# Patient Record
Sex: Female | Born: 1975 | Race: Black or African American | Hispanic: No | Marital: Married | Smoking: Never smoker
Health system: Southern US, Community
[De-identification: ages and names within clinical notes are randomized; demographics above are authoritative.]

## PROBLEM LIST (undated history)

## (undated) DIAGNOSIS — J45909 Unspecified asthma, uncomplicated: Secondary | ICD-10-CM

## (undated) DIAGNOSIS — T7840XA Allergy, unspecified, initial encounter: Secondary | ICD-10-CM

## (undated) HISTORY — DX: Unspecified asthma, uncomplicated: J45.909

## (undated) HISTORY — DX: Allergy, unspecified, initial encounter: T78.40XA

---

## 2002-04-24 ENCOUNTER — Ambulatory Visit (HOSPITAL_COMMUNITY): Admission: RE | Admit: 2002-04-24 | Discharge: 2002-04-24 | Payer: Self-pay

## 2002-08-07 ENCOUNTER — Ambulatory Visit (HOSPITAL_COMMUNITY): Admission: RE | Admit: 2002-08-07 | Discharge: 2002-08-07 | Payer: Self-pay

## 2002-08-08 ENCOUNTER — Observation Stay (HOSPITAL_COMMUNITY): Admission: AD | Admit: 2002-08-08 | Discharge: 2002-08-08 | Payer: Self-pay

## 2003-05-19 ENCOUNTER — Ambulatory Visit (HOSPITAL_COMMUNITY): Admission: RE | Admit: 2003-05-19 | Discharge: 2003-05-19 | Payer: Self-pay

## 2003-08-26 ENCOUNTER — Ambulatory Visit (HOSPITAL_COMMUNITY): Admission: RE | Admit: 2003-08-26 | Discharge: 2003-08-26 | Payer: Self-pay | Admitting: Obstetrics and Gynecology

## 2003-08-26 ENCOUNTER — Encounter: Payer: Self-pay | Admitting: Obstetrics and Gynecology

## 2003-09-02 ENCOUNTER — Encounter: Payer: Self-pay | Admitting: Obstetrics and Gynecology

## 2003-09-02 ENCOUNTER — Ambulatory Visit (HOSPITAL_COMMUNITY): Admission: RE | Admit: 2003-09-02 | Discharge: 2003-09-02 | Payer: Self-pay | Admitting: Obstetrics and Gynecology

## 2003-09-09 ENCOUNTER — Encounter: Payer: Self-pay | Admitting: Obstetrics and Gynecology

## 2003-09-09 ENCOUNTER — Ambulatory Visit (HOSPITAL_COMMUNITY): Admission: RE | Admit: 2003-09-09 | Discharge: 2003-09-09 | Payer: Self-pay | Admitting: Obstetrics and Gynecology

## 2003-09-24 ENCOUNTER — Inpatient Hospital Stay (HOSPITAL_COMMUNITY): Admission: RE | Admit: 2003-09-24 | Discharge: 2003-09-30 | Payer: Self-pay | Admitting: Obstetrics and Gynecology

## 2003-10-13 ENCOUNTER — Ambulatory Visit (HOSPITAL_COMMUNITY): Admission: RE | Admit: 2003-10-13 | Discharge: 2003-10-13 | Payer: Self-pay | Admitting: Obstetrics and Gynecology

## 2003-11-03 ENCOUNTER — Encounter (INDEPENDENT_AMBULATORY_CARE_PROVIDER_SITE_OTHER): Payer: Self-pay

## 2003-11-03 ENCOUNTER — Inpatient Hospital Stay (HOSPITAL_COMMUNITY): Admission: AD | Admit: 2003-11-03 | Discharge: 2003-11-06 | Payer: Self-pay | Admitting: Obstetrics and Gynecology

## 2004-04-25 ENCOUNTER — Other Ambulatory Visit: Admission: RE | Admit: 2004-04-25 | Discharge: 2004-04-25 | Payer: Self-pay | Admitting: Obstetrics and Gynecology

## 2005-04-20 ENCOUNTER — Other Ambulatory Visit: Admission: RE | Admit: 2005-04-20 | Discharge: 2005-04-20 | Payer: Self-pay | Admitting: Obstetrics and Gynecology

## 2005-05-24 ENCOUNTER — Observation Stay (HOSPITAL_COMMUNITY): Admission: AD | Admit: 2005-05-24 | Discharge: 2005-05-25 | Payer: Self-pay | Admitting: Obstetrics and Gynecology

## 2005-11-10 ENCOUNTER — Inpatient Hospital Stay (HOSPITAL_COMMUNITY): Admission: AD | Admit: 2005-11-10 | Discharge: 2005-11-12 | Payer: Self-pay | Admitting: Obstetrics and Gynecology

## 2006-06-20 ENCOUNTER — Other Ambulatory Visit: Admission: RE | Admit: 2006-06-20 | Discharge: 2006-06-20 | Payer: Self-pay | Admitting: Obstetrics and Gynecology

## 2007-10-14 ENCOUNTER — Other Ambulatory Visit: Admission: RE | Admit: 2007-10-14 | Discharge: 2007-10-14 | Payer: Self-pay | Admitting: Obstetrics and Gynecology

## 2008-01-01 ENCOUNTER — Ambulatory Visit: Payer: Self-pay | Admitting: Family Medicine

## 2008-01-01 ENCOUNTER — Ambulatory Visit: Payer: Self-pay | Admitting: *Deleted

## 2008-01-01 ENCOUNTER — Encounter (INDEPENDENT_AMBULATORY_CARE_PROVIDER_SITE_OTHER): Payer: Self-pay | Admitting: Internal Medicine

## 2008-01-01 LAB — CONVERTED CEMR LAB
ALT: 10 units/L (ref 0–35)
AST: 15 units/L (ref 0–37)
Albumin: 4.2 g/dL (ref 3.5–5.2)
Alkaline Phosphatase: 50 units/L (ref 39–117)
BUN: 12 mg/dL (ref 6–23)
Basophils Absolute: 0 10*3/uL (ref 0.0–0.1)
Basophils Relative: 1 % (ref 0–1)
CO2: 24 meq/L (ref 19–32)
Calcium: 9.5 mg/dL (ref 8.4–10.5)
Chloride: 102 meq/L (ref 96–112)
Cholesterol: 179 mg/dL (ref 0–200)
Creatinine, Ser: 0.81 mg/dL (ref 0.40–1.20)
Eosinophils Absolute: 0.2 10*3/uL (ref 0.0–0.7)
Eosinophils Relative: 3 % (ref 0–5)
Glucose, Bld: 78 mg/dL (ref 70–99)
HCT: 44.6 % (ref 36.0–46.0)
HDL: 53 mg/dL (ref >39)
Hemoglobin: 14.6 g/dL (ref 12.0–15.0)
LDL Cholesterol: 112 mg/dL — ABNORMAL HIGH (ref 0–99)
Lymphocytes Relative: 42 % (ref 12–46)
Lymphs Abs: 2.7 10*3/uL (ref 0.7–4.0)
MCHC: 32.7 g/dL (ref 30.0–36.0)
MCV: 86.4 fL (ref 78.0–100.0)
Monocytes Absolute: 0.4 10*3/uL (ref 0.1–1.0)
Monocytes Relative: 6 % (ref 3–12)
Neutro Abs: 3.1 10*3/uL (ref 1.7–7.7)
Neutrophils Relative %: 48 % (ref 43–77)
Platelets: 323 10*3/uL (ref 150–400)
Potassium: 4.2 meq/L (ref 3.5–5.3)
RBC: 5.16 M/uL — ABNORMAL HIGH (ref 3.87–5.11)
RDW: 12.5 % (ref 11.5–15.5)
Sodium: 137 meq/L (ref 135–145)
Total Bilirubin: 2.5 mg/dL — ABNORMAL HIGH (ref 0.3–1.2)
Total CHOL/HDL Ratio: 3.4
Total Protein: 7.9 g/dL (ref 6.0–8.3)
Triglycerides: 69 mg/dL (ref <150)
VLDL: 14 mg/dL (ref 0–40)
WBC: 6.4 10*3/uL (ref 4.0–10.5)

## 2009-04-16 ENCOUNTER — Other Ambulatory Visit: Admission: RE | Admit: 2009-04-16 | Discharge: 2009-04-16 | Payer: Self-pay | Admitting: Obstetrics and Gynecology

## 2010-03-29 ENCOUNTER — Ambulatory Visit: Payer: Self-pay | Admitting: Internal Medicine

## 2010-04-29 ENCOUNTER — Other Ambulatory Visit: Admission: RE | Admit: 2010-04-29 | Discharge: 2010-04-29 | Payer: Self-pay | Admitting: Obstetrics and Gynecology

## 2011-04-07 NOTE — Op Note (Signed)
Wendy Tyler, Wendy Tyler              ACCOUNT NO.:  [id number]   MEDICAL RECORD NO.:  [id number]          PATIENT TYPE:  MAT   LOCATION:  [facility]                          FACILITY:  [facility]   PHYSICIAN:  Charles A. Delcambre, MDDATE OF BIRTH:  Jan 01, 1976   DATE OF PROCEDURE:  05/24/2005  DATE OF DISCHARGE:                                 OPERATIVE REPORT   PREOPERATIVE DIAGNOSES:  1.  Incompetent cervix.  2.  A 14-week intrauterine pregnancy.   POSTOPERATIVE DIAGNOSES:  1.  Incompetent cervix.  2.  A 14-week intrauterine pregnancy.   PROCEDURE:  Cervical cerclage, McDonald type.   SURGEON:  Charles A. Sydnee Cabal, M.D.   ASSISTANT:  None.   COMPLICATIONS:  Bleeding vessel at 4 o'clock.   ANESTHESIA:  Spinal.   SPECIMENS:  None.   DESCRIPTION OF PROCEDURE:  The patient was taken to the operating room and  placed in the supine position after spinal was done.  She was then placed in  the dorsal lithotomy position, and sterile prep and drape was undertaken.  Weighted speculum was placed in the vagina.  Ring forceps were used to grasp  the anterior and posterior lips of the cervix.  A #2 Ethelon was placed,  with stitches at 11 to 10 o'clock, 8 to 7 o'clock, 5 to 4 o'clock, and 2 to  1 o'clock and tied with enough tension to pass a small dilator through the  cervical os, with the knot at 12 o'clock.  Knot was left long enough to find  the suture for ease in cutting suture away later.  This cinched the cervix  down nicely.  There was active bleeding noted from the stitch penetrating at  4 o'clock, with tension from tying the suture down.  This did slow somewhat,  but a figure-of-eight suture with 2-0 Vicryl in this area did achieve  excellent hemostasis.  Further visualization yielded excellent hemostasis.  There was no evidence of rupture of membranes or prolapse of membranes or  significant effacement of the cervix.  The patient tolerated the procedure  well and was taken to recovery  with physician present .      CAD/MEDQ  D:  05/24/2005  T:  05/24/2005  Job:  [id number]

## 2011-04-07 NOTE — Discharge Summary (Signed)
NAME:  Wendy Tyler, Wendy Tyler                        ACCOUNT NO.:  [id number]   MEDICAL RECORD NO.:  [id number]                   PATIENT TYPE:  INP   LOCATION:  9110                                 FACILITY:  [facility]   PHYSICIAN:  Charles A. Sydnee Cabal, MD            DATE OF BIRTH:  June 23, 1976   DATE OF ADMISSION:  09/24/2003  DATE OF DISCHARGE:  09/30/2003                                 DISCHARGE SUMMARY   PRIMARY DISCHARGE DIAGNOSIS:  Preterm labor versus incompetent cervix.   PROCEDURE:  Hospitalization for bedrest, has failed bedrest at home.  Cervix  melted to 0.   For history and physical, see please dictated note in the chart.   HOSPITAL COURSE:  The patient was admitted and underwent bedrest.  She had  daily NSTs, was not noted to be contracting but prophylactically with  history of cervix going to 0 length, prophylactic terbutaline 2.5 mg q.4  hours was given.  Betamethasone was given two doses 24-hours apart during  the hospitalization initially.  She continued to be stable throughout the  hospitalization without further problem, and for this reason with cervix  posterior, soft and 90% to 100% completely effaced, vertex to palpation less  than or equal to 1 cm, no significant change from prior examination, the  patient was discharged home after reaching gestational age of [redacted] weeks 2  days.  She was discharged home to complete bedrest with only bathroom  privileges and to continue terbutaline 2.5 mg p.o. q.4 hours until 36 weeks.                                               Charles A. Sydnee Cabal, MD    CAD/MEDQ  D:  10/21/2003  T:  10/21/2003  Job:  [id number]

## 2011-05-22 ENCOUNTER — Other Ambulatory Visit (HOSPITAL_COMMUNITY)
Admission: RE | Admit: 2011-05-22 | Discharge: 2011-05-22 | Disposition: A | Discharge status: Home / Self Care | Payer: Medicaid Other | Source: Ambulatory Visit | Attending: Obstetrics and Gynecology | Admitting: Obstetrics and Gynecology

## 2011-05-22 ENCOUNTER — Other Ambulatory Visit: Payer: Self-pay | Admitting: Obstetrics and Gynecology

## 2011-05-22 DIAGNOSIS — Z01419 Encounter for gynecological examination (general) (routine) without abnormal findings: Secondary | ICD-10-CM | POA: Insufficient documentation

## 2012-05-21 ENCOUNTER — Other Ambulatory Visit: Payer: Self-pay | Admitting: Obstetrics and Gynecology

## 2012-05-21 ENCOUNTER — Other Ambulatory Visit (HOSPITAL_COMMUNITY)
Admission: RE | Admit: 2012-05-21 | Discharge: 2012-05-21 | Disposition: A | Discharge status: Home / Self Care | Payer: Medicaid Other | Source: Ambulatory Visit | Attending: Obstetrics and Gynecology | Admitting: Obstetrics and Gynecology

## 2012-05-21 DIAGNOSIS — Z01419 Encounter for gynecological examination (general) (routine) without abnormal findings: Secondary | ICD-10-CM | POA: Insufficient documentation

## 2012-08-08 ENCOUNTER — Emergency Department (HOSPITAL_COMMUNITY)
Admission: EM | Admit: 2012-08-08 | Discharge: 2012-08-09 | Disposition: A | Discharge status: Home / Self Care | Payer: Self-pay | Attending: Emergency Medicine | Admitting: Emergency Medicine

## 2012-08-08 ENCOUNTER — Emergency Department (HOSPITAL_COMMUNITY): Payer: Self-pay

## 2012-08-08 ENCOUNTER — Encounter (HOSPITAL_COMMUNITY): Payer: Self-pay | Admitting: *Deleted

## 2012-08-08 DIAGNOSIS — S43014A Anterior dislocation of right humerus, initial encounter: Secondary | ICD-10-CM | POA: Diagnosis present

## 2012-08-08 DIAGNOSIS — W108XXA Fall (on) (from) other stairs and steps, initial encounter: Secondary | ICD-10-CM | POA: Insufficient documentation

## 2012-08-08 DIAGNOSIS — S43016A Anterior dislocation of unspecified humerus, initial encounter: Secondary | ICD-10-CM | POA: Insufficient documentation

## 2012-08-08 MED ORDER — FENTANYL CITRATE 0.05 MG/ML IJ SOLN
100.0000 ug | Freq: Once | INTRAMUSCULAR | Status: AC
  Administered 2012-08-08: 100 ug via INTRAVENOUS
  Filled 2012-08-08: qty 2

## 2012-08-08 MED ORDER — PROPOFOL 10 MG/ML IV BOLUS
INTRAVENOUS | Status: AC
  Filled 2012-08-08: qty 40

## 2012-08-08 MED ORDER — MORPHINE SULFATE 4 MG/ML IJ SOLN
4.0000 mg | Freq: Once | INTRAMUSCULAR | Status: AC
  Administered 2012-08-08: 4 mg via INTRAVENOUS
  Filled 2012-08-08: qty 1

## 2012-08-08 MED ORDER — PROPOFOL 10 MG/ML IV BOLUS: 0.5000 mg/kg | Freq: Once | INTRAVENOUS | Status: DC

## 2012-08-08 MED ORDER — SODIUM CHLORIDE 0.9 % IV BOLUS (SEPSIS)
1000.0000 mL | INTRAVENOUS | Status: AC
  Administered 2012-08-08: 1000 mL via INTRAVENOUS

## 2012-08-08 NOTE — ED Notes (Signed)
Pt. In xray 

## 2012-08-08 NOTE — ED Provider Notes (Signed)
History     CSN: [id number]  Arrival date & time 08/08/12  2110   First MD Initiated Contact with Patient 08/08/12 2158      Chief Complaint  Patient presents with  . poss shoulder dislocation     (Consider location/radiation/quality/duration/timing/severity/associated sxs/prior treatment) Patient is a 36 y.o. female presenting with shoulder injury. The history is provided by the patient.  Shoulder Injury This is a new problem. The current episode started today. The problem occurs constantly. The problem has been unchanged. Pertinent negatives include no abdominal pain, chest pain, congestion, coughing, fatigue, fever, headaches, nausea, neck pain or vomiting. Exacerbated by: fell backwards bracing herself w/ right arm. She has tried nothing for the symptoms. The treatment provided mild relief.    History reviewed. No pertinent past medical history.  History reviewed. No pertinent past surgical history.  No family history on file.  History  Substance Use Topics  . Smoking status: Never Smoker   . Smokeless tobacco: Not on file  . Alcohol Use: No    OB History    Grav Para Term Preterm Abortions TAB SAB Ect Mult Living                  Review of Systems  Constitutional: Negative for fever and fatigue.  HENT: Negative for congestion, drooling and neck pain.   Eyes: Negative for pain.  Respiratory: Negative for cough and shortness of breath.   Cardiovascular: Negative for chest pain.  Gastrointestinal: Negative for nausea, vomiting, abdominal pain and diarrhea.  Genitourinary: Negative for dysuria and hematuria.  Musculoskeletal: Negative for back pain and gait problem.  Skin: Negative for color change.  Neurological: Negative for dizziness and headaches.  Hematological: Negative for adenopathy.  Psychiatric/Behavioral: Negative for behavioral problems.  All other systems reviewed and are negative.    Allergies  Review of patient's allergies indicates no known  allergies.  Home Medications   Current Outpatient Rx  Name Route Sig Dispense Refill  . ALBUTEROL SULFATE HFA 108 (90 BASE) MCG/ACT IN AERS Inhalation Inhale 2 puffs into the lungs every 4 (four) hours as needed. For shortness of breath    . OXYCODONE-ACETAMINOPHEN 5-325 MG PO TABS Oral Take 2 tablets by mouth every 4 (four) hours as needed for pain. 10 tablet 0    BP 102/63  Pulse 73  Temp 98.1 F (36.7 C) (Oral)  Resp 18  Ht 5\' 5"  (1.651 m)  Wt 152 lb (68.947 kg)  BMI 25.29 kg/m2  SpO2 98%  Physical Exam  Nursing note and vitals reviewed. Constitutional: She is oriented to person, place, and time. She appears well-developed and well-nourished.  HENT:  Head: Normocephalic.  Mouth/Throat: Oropharynx is clear and moist. No oropharyngeal exudate.  Eyes: Conjunctivae normal and EOM are normal. Pupils are equal, round, and reactive to light.  Neck: Normal range of motion. Neck supple.  Cardiovascular: Normal rate, regular rhythm, normal heart sounds and intact distal pulses.  Exam reveals no gallop and no friction rub.   No murmur heard. Pulmonary/Chest: Effort normal and breath sounds normal. No respiratory distress. She has no wheezes.  Abdominal: Soft. Bowel sounds are normal. There is no tenderness. There is no rebound and no guarding.  Musculoskeletal: Normal range of motion. She exhibits no edema and no tenderness.       Prominent right anterior shoulder cw anterior dislocation. Normal sensation throughout RUE, 2+ distal pulses. Normal rom of right elbow/wrist/digits.   Neurological: She is alert and oriented to person, place, and  time.  Skin: Skin is warm and dry.  Psychiatric: She has a normal mood and affect. Her behavior is normal.    ED Course  ORTHOPEDIC INJURY TREATMENT Performed by: Purvis Sheffield Authorized by: Joya Gaskins Consent: Verbal consent obtained. Written consent obtained. Risks and benefits: risks, benefits and alternatives were  discussed Consent given by: patient Patient understanding: patient states understanding of the procedure being performed Patient consent: the patient's understanding of the procedure matches consent given Required items: required blood products, implants, devices, and special equipment available Patient identity confirmed: verbally with patient, arm band, hospital-assigned identification number and provided demographic data Time out: Immediately prior to procedure a "time out" was called to verify the correct patient, procedure, equipment, support staff and site/side marked as required. Injury location: shoulder Location details: right shoulder Injury type: dislocation Dislocation type: anterior Hill-Sachs deformity: noChronicity: new Pre-procedure distal perfusion: normal Pre-procedure neurological function: normal Pre-procedure range of motion: normal Local anesthesia used: no Patient sedated: yes Sedation type: moderate (conscious) sedation Sedatives: etomidate Manipulation performed: yes Reduction method: traction and counter traction Reduction successful: yes X-ray confirmed reduction: yes Immobilization: sling Post-procedure neurovascular assessment: post-procedure neurovascularly intact Post-procedure distal perfusion: normal Post-procedure neurological function: normal Post-procedure range of motion: normal Patient tolerance: Patient tolerated the procedure well with no immediate complications.   (including critical care time)  Labs Reviewed - No data to display Dg Shoulder Right  08/08/2012  *RADIOLOGY REPORT*  Clinical Data: Fall, pain.  RIGHT SHOULDER - 2+ VIEW  Comparison: None.  Findings: Humerus is anteriorly dislocated.  The acromioclavicular joint is intact.  No fracture is identified. Imaged right lung and ribs are unremarkable.  IMPRESSION: Anterior dislocation right shoulder.   Original Report Authenticated By: Bernadene Bell. D'ALESSIO, M.D.    Dg Shoulder Right  Port  08/09/2012  *RADIOLOGY REPORT*  Clinical Data: Reduction of dislocation.  PORTABLE RIGHT SHOULDER - 2+ VIEW  Comparison: Plain films 08/08/2012.  Findings: The shoulder has been reduced.  No new abnormality.  IMPRESSION: Successful reduction of dislocation.   Original Report Authenticated By: Bernadene Bell. D'ALESSIO, M.D.      1. Dislocation of shoulder, anterior, right, closed       MDM  4:32 PM 36 y.o. female pw fall down 3-4 stairs. Pt was walking down stairs when she slipped and fell backwards onto her buttocks bracing herself w/ her arms. Pt denies hitting head or loc. Pt's exam concerning for ant right shoulder dislocation. This confirmed by x-ray. Pt has no hx of shoulder dislocation. Will perform conscious sedation and reduction.      Performed conscious sedation, shoulder reduced. Will place in immobilizer.  I have discussed the diagnosis/risks/treatment options with the patient and believe the pt to be eligible for discharge home to follow-up with pcp as needed. We also discussed returning to the ED immediately if new or worsening sx occur. We discussed the sx which are most concerning (e.g., worsening pain, numbness) that necessitate immediate return. Any new prescriptions provided to the patient are listed below.  New Prescriptions   OXYCODONE-ACETAMINOPHEN (PERCOCET) 5-325 MG PER TABLET    Take 2 tablets by mouth every 4 (four) hours as needed for pain.    Clinical Impression 1. Dislocation of shoulder, anterior, right, closed        Purvis Sheffield, MD 08/09/12 [phone]

## 2012-08-08 NOTE — ED Notes (Signed)
The pt fell going up the stairs  pta and she has a sl deformity of the rt shoulder and some minor pain in her lt elbow

## 2012-08-09 ENCOUNTER — Emergency Department (HOSPITAL_COMMUNITY): Payer: Self-pay

## 2012-08-09 DIAGNOSIS — S43014A Anterior dislocation of right humerus, initial encounter: Secondary | ICD-10-CM | POA: Diagnosis present

## 2012-08-09 MED ORDER — ETOMIDATE 2 MG/ML IV SOLN
INTRAVENOUS | Status: AC
  Filled 2012-08-09: qty 10

## 2012-08-09 MED ORDER — ETOMIDATE 2 MG/ML IV SOLN
INTRAVENOUS | Status: AC | PRN
Start: 2012-08-09 — End: 2012-08-09
  Administered 2012-08-09: 3 mg via INTRAVENOUS
  Administered 2012-08-09: 2 mg via INTRAVENOUS
  Administered 2012-08-09: 5 mg via INTRAVENOUS

## 2012-08-09 MED ORDER — OXYCODONE-ACETAMINOPHEN 5-325 MG PO TABS: 2.0000 | ORAL_TABLET | ORAL | Status: DC | PRN

## 2012-08-09 MED ORDER — ETOMIDATE 2 MG/ML IV SOLN
10.0000 mg | Freq: Once | INTRAVENOUS | Status: AC
  Administered 2012-08-09: 02:00:00 via INTRAVENOUS

## 2012-08-09 NOTE — ED Notes (Signed)
Pt. Stable, alert and oriented x4. No respiratory distress. Pt. Has ride.

## 2012-08-09 NOTE — ED Provider Notes (Signed)
I have personally seen and examined the patient and discussed plan of care with the resident.  I was present for entire procedure.  I have reviewed the appropriate documentation on PMH/FH/Soc. History.  I have reviewed the documentation of the resident and agree.  Pt with no other signs of trauma, tolerated reduction well and neurovascularly intact after reduction Advised ortho f/u   Procedural sedation Performed by: Joya Gaskins Consent: Verbal consent obtained. Risks and benefits: risks, benefits and alternatives were discussed Required items: required blood products, implants, devices, and special equipment available Patient identity confirmed: arm band and provided demographic data Time out: Immediately prior to procedure a "time out" was called to verify the correct patient, procedure, equipment, support staff and site/side marked as required.  Sedation type: moderate (conscious) sedation NPO time confirmed and considedered  Sedatives: ETOMIDATE  Physician Time at Bedside: 15  Vitals: Vital signs were monitored during sedation. Cardiac Monitor, pulse oximeter Patient tolerance: Patient tolerated the procedure well with no immediate complications. Comments: Pt with uneventful recovery. Returned to pre-procedural sedation baseline   Joya Gaskins, MD 08/09/12 2201

## 2013-01-10 ENCOUNTER — Ambulatory Visit: Payer: Self-pay | Admitting: Family Medicine

## 2013-01-10 VITALS — BP 118/86 | HR 71 | Temp 98.5°F | Resp 16 | Ht 63.0 in | Wt 145.0 lb

## 2013-01-10 DIAGNOSIS — Z23 Encounter for immunization: Secondary | ICD-10-CM

## 2013-01-10 MED ORDER — TETANUS-DIPHTH-ACELL PERTUSSIS 5-2.5-18.5 LF-MCG/0.5 IM SUSP
0.5000 mL | Freq: Once | INTRAMUSCULAR | Status: AC
  Administered 2013-01-10: 0.5 mL via INTRAMUSCULAR

## 2013-01-10 NOTE — Progress Notes (Signed)
Subjective:    Patient ID: Wendy Tyler, female    DOB: 1976-07-18, 37 y.o.   MRN: [id number]  HPI Wendy Tyler is a 37 y.o. 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  Hx of asthma - intermittent, no recent use of albuterol.  R shoulder dislocation last year - doing ok since.   Unknown last td - approximately 1993.  Vaccinations utd as of last year.    Tuberculosis Risk Questionnaire  1. Were you born outside the [place] in one of the following parts of the world:    [place], [place], [place], [place] or [place]?  No  2. Have you traveled outside the [place] and lived for more than one month in one of the following parts of the world:  [place], [place], [place], [place] or [place]?  No  3. Do you have a compromised immune system such as from any of the following conditions:  HIV/AIDS, organ or bone marrow transplantation, diabetes, immunosuppressive   medicines (e.g. Prednisone, Remicaide), leukemia, lymphoma, cancer of the   head or neck, gastrectomy or jejunal bypass, end-stage renal disease (on   dialysis), or silicosis?  No    4. Have you ever done one of the following:    Used crack cocaine, injected illegal drugs, worked or resided in jail or prison,   worked or resided at a homeless shelter, or worked as a Research scientist (physical sciences) in   direct contact with patients?  No  5. Have you ever been exposed to anyone with infectious tuberculosis?  No   Tuberculosis Symptom Questionnaire  Do you currently have any of the following symptoms?  1. Unexplained cough lasting more than 3 weeks? No  Unexplained fever lasting more than 3 weeks. No   3. Night Sweats (sweating that leaves the bedclothes and sheets wet)   No  4. Shortness of Breath No  5. Chest Pain No  6. Unintentional weight loss  No  7. Unexplained fatigue (very tired for no reason) No   Review of Systems As  above. Rare L knee, R shoulder click, but no pain/debility or limitations in mvmt.     Objective:   Physical Exam  Vitals reviewed. Constitutional: She is oriented to person, place, and time. She appears well-developed and well-nourished.  HENT:  Head: Normocephalic and atraumatic.  Right Ear: External ear normal.  Left Ear: External ear normal.  Mouth/Throat: Oropharynx is clear and moist.  Eyes: Conjunctivae are normal. Pupils are equal, round, and reactive to light.  Neck: Normal range of motion. Neck supple. No thyromegaly present.  Cardiovascular: Normal rate, regular rhythm, normal heart sounds and intact distal pulses.   No murmur heard. Pulmonary/Chest: Effort normal and breath sounds normal. No respiratory distress. She has no wheezes.  Abdominal: Soft. Bowel sounds are normal. There is no tenderness.  Musculoskeletal: Normal range of motion. She exhibits no edema and no tenderness.  Lymphadenopathy:    She has no cervical adenopathy.  Neurological: She is alert and oriented to person, place, and time.  Skin: Skin is warm and dry. No rash noted.  Psychiatric: She has a normal mood and affect. Her behavior is normal. Thought content normal.       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  tdap given,  flu vaccine recommended.   r eye decreased vision. Recommended eval  by optho/optometry.

## 2013-05-22 ENCOUNTER — Other Ambulatory Visit: Payer: Self-pay | Admitting: Obstetrics and Gynecology

## 2013-05-22 ENCOUNTER — Other Ambulatory Visit (HOSPITAL_COMMUNITY)
Admission: RE | Admit: 2013-05-22 | Discharge: 2013-05-22 | Disposition: A | Discharge status: Home / Self Care | Payer: Medicaid Other | Source: Ambulatory Visit | Attending: Obstetrics and Gynecology | Admitting: Obstetrics and Gynecology

## 2013-05-22 DIAGNOSIS — Z01419 Encounter for gynecological examination (general) (routine) without abnormal findings: Secondary | ICD-10-CM | POA: Insufficient documentation

## 2013-05-22 DIAGNOSIS — Z1151 Encounter for screening for human papillomavirus (HPV): Secondary | ICD-10-CM | POA: Insufficient documentation

## 2014-05-25 ENCOUNTER — Other Ambulatory Visit: Payer: Self-pay | Admitting: Obstetrics and Gynecology

## 2014-05-25 ENCOUNTER — Other Ambulatory Visit (HOSPITAL_COMMUNITY)
Admission: RE | Admit: 2014-05-25 | Discharge: 2014-05-25 | Disposition: A | Discharge status: Home / Self Care | Payer: Medicaid Other | Source: Ambulatory Visit | Attending: Obstetrics and Gynecology | Admitting: Obstetrics and Gynecology

## 2014-05-25 DIAGNOSIS — Z01419 Encounter for gynecological examination (general) (routine) without abnormal findings: Secondary | ICD-10-CM | POA: Diagnosis not present

## 2014-05-26 LAB — CYTOLOGY - PAP

## 2015-06-22 ENCOUNTER — Other Ambulatory Visit: Payer: Self-pay | Admitting: Obstetrics and Gynecology

## 2015-06-22 ENCOUNTER — Other Ambulatory Visit (HOSPITAL_COMMUNITY)
Admission: RE | Admit: 2015-06-22 | Discharge: 2015-06-22 | Disposition: A | Discharge status: Home / Self Care | Payer: BC Managed Care – PPO | Source: Ambulatory Visit | Attending: Obstetrics and Gynecology | Admitting: Obstetrics and Gynecology

## 2015-06-22 DIAGNOSIS — Z01411 Encounter for gynecological examination (general) (routine) with abnormal findings: Secondary | ICD-10-CM | POA: Diagnosis present

## 2015-06-23 LAB — CYTOLOGY - PAP

## 2015-07-12 ENCOUNTER — Other Ambulatory Visit: Payer: Self-pay

## 2015-07-12 DIAGNOSIS — Z1231 Encounter for screening mammogram for malignant neoplasm of breast: Secondary | ICD-10-CM

## 2015-07-15 ENCOUNTER — Ambulatory Visit
Admission: RE | Admit: 2015-07-15 | Discharge: 2015-07-15 | Disposition: A | Discharge status: Home / Self Care | Payer: BC Managed Care – PPO | Source: Ambulatory Visit

## 2015-07-15 DIAGNOSIS — Z1231 Encounter for screening mammogram for malignant neoplasm of breast: Secondary | ICD-10-CM

## 2015-07-19 ENCOUNTER — Other Ambulatory Visit: Payer: Self-pay | Admitting: Obstetrics and Gynecology

## 2015-07-19 DIAGNOSIS — R928 Other abnormal and inconclusive findings on diagnostic imaging of breast: Secondary | ICD-10-CM

## 2015-07-30 ENCOUNTER — Ambulatory Visit
Admission: RE | Admit: 2015-07-30 | Discharge: 2015-07-30 | Disposition: A | Discharge status: Home / Self Care | Payer: BC Managed Care – PPO | Source: Ambulatory Visit | Attending: Obstetrics and Gynecology | Admitting: Obstetrics and Gynecology

## 2015-07-30 DIAGNOSIS — R928 Other abnormal and inconclusive findings on diagnostic imaging of breast: Secondary | ICD-10-CM

## 2016-06-01 ENCOUNTER — Other Ambulatory Visit: Payer: Self-pay | Admitting: Obstetrics and Gynecology

## 2016-06-01 DIAGNOSIS — N6320 Unspecified lump in the left breast, unspecified quadrant: Secondary | ICD-10-CM

## 2016-07-04 ENCOUNTER — Other Ambulatory Visit: Payer: Self-pay | Admitting: Obstetrics and Gynecology

## 2016-07-04 ENCOUNTER — Other Ambulatory Visit (HOSPITAL_COMMUNITY)
Admission: RE | Admit: 2016-07-04 | Discharge: 2016-07-04 | Disposition: A | Discharge status: Home / Self Care | Payer: BC Managed Care – PPO | Source: Ambulatory Visit | Attending: Obstetrics and Gynecology | Admitting: Obstetrics and Gynecology

## 2016-07-04 DIAGNOSIS — Z01419 Encounter for gynecological examination (general) (routine) without abnormal findings: Secondary | ICD-10-CM | POA: Insufficient documentation

## 2016-07-04 DIAGNOSIS — R87810 Cervical high risk human papillomavirus (HPV) DNA test positive: Secondary | ICD-10-CM | POA: Diagnosis present

## 2016-07-04 DIAGNOSIS — Z1151 Encounter for screening for human papillomavirus (HPV): Secondary | ICD-10-CM | POA: Diagnosis not present

## 2016-07-07 LAB — CYTOLOGY - PAP

## 2016-07-18 ENCOUNTER — Ambulatory Visit
Admission: RE | Admit: 2016-07-18 | Discharge: 2016-07-18 | Disposition: A | Discharge status: Home / Self Care | Payer: BC Managed Care – PPO | Source: Ambulatory Visit | Attending: Obstetrics and Gynecology | Admitting: Obstetrics and Gynecology

## 2016-07-18 DIAGNOSIS — N6320 Unspecified lump in the left breast, unspecified quadrant: Secondary | ICD-10-CM

## 2016-12-25 IMAGING — MG MM SCREEN MAMMOGRAM BILATERAL
5 series · 5 of 5 positions shown · non-contrast
Comparison: None

CLINICAL DATA: Screening.

EXAM:
DIGITAL SCREENING BILATERAL MAMMOGRAM WITH CAD

[R CC]
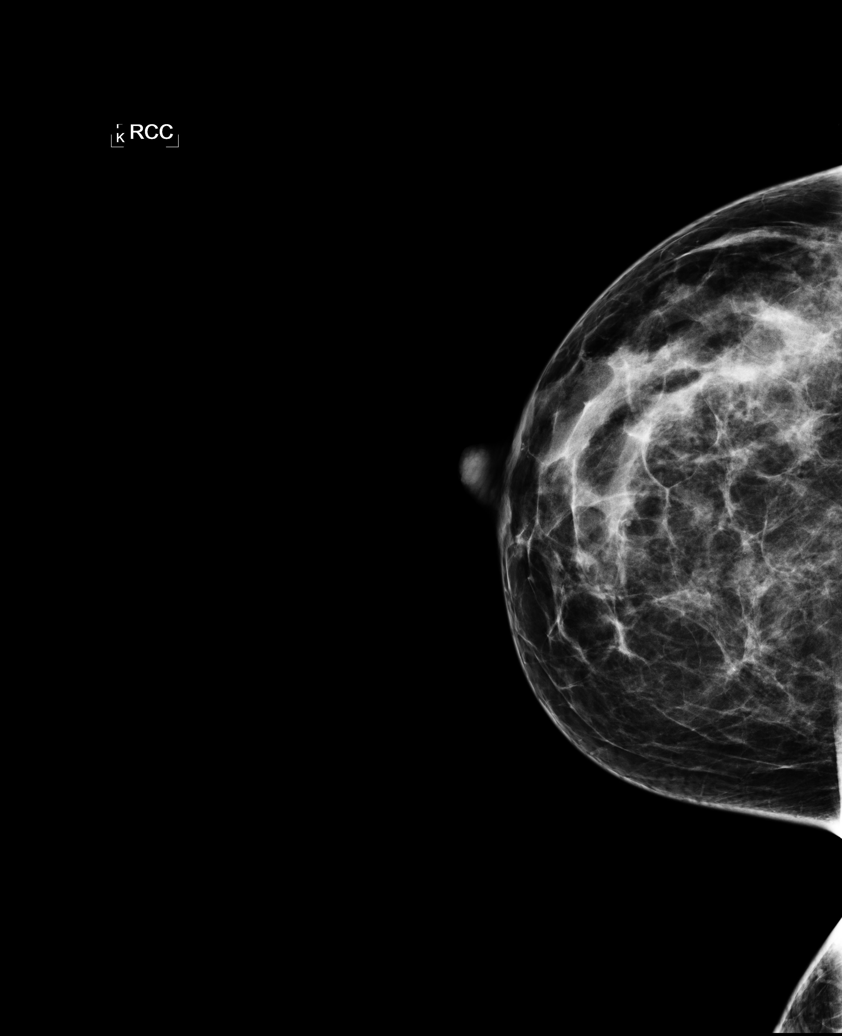

[L CC]
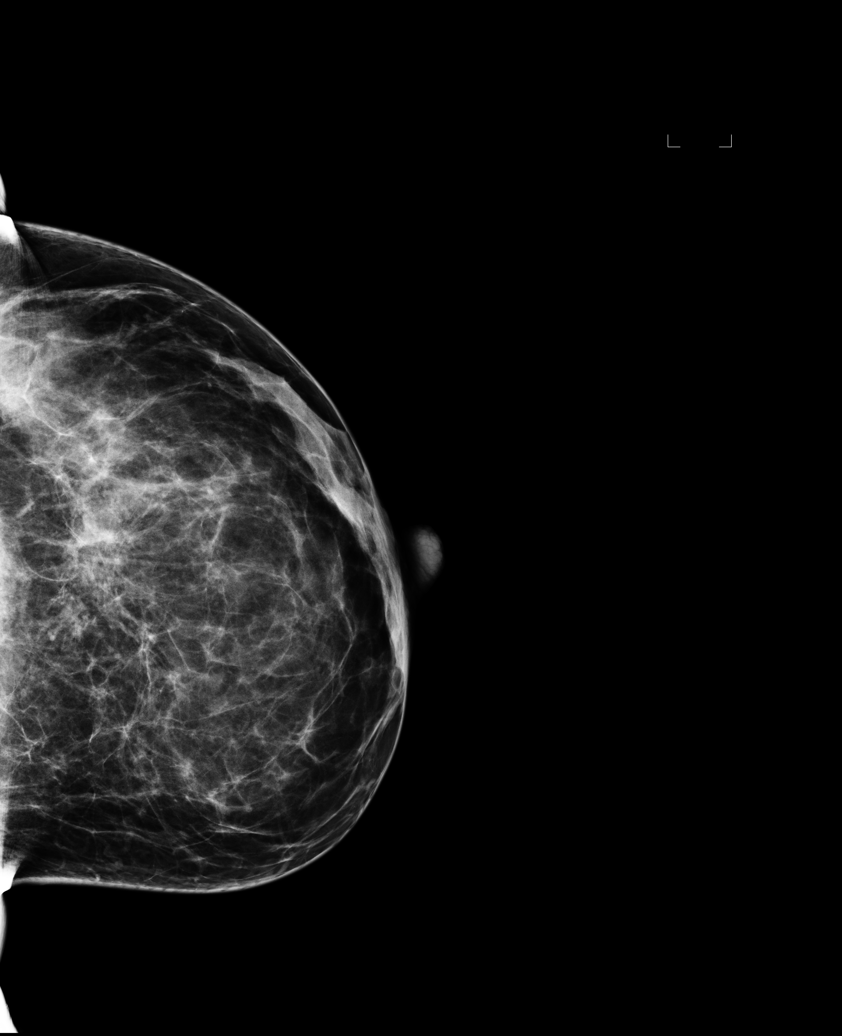

[L MLO]
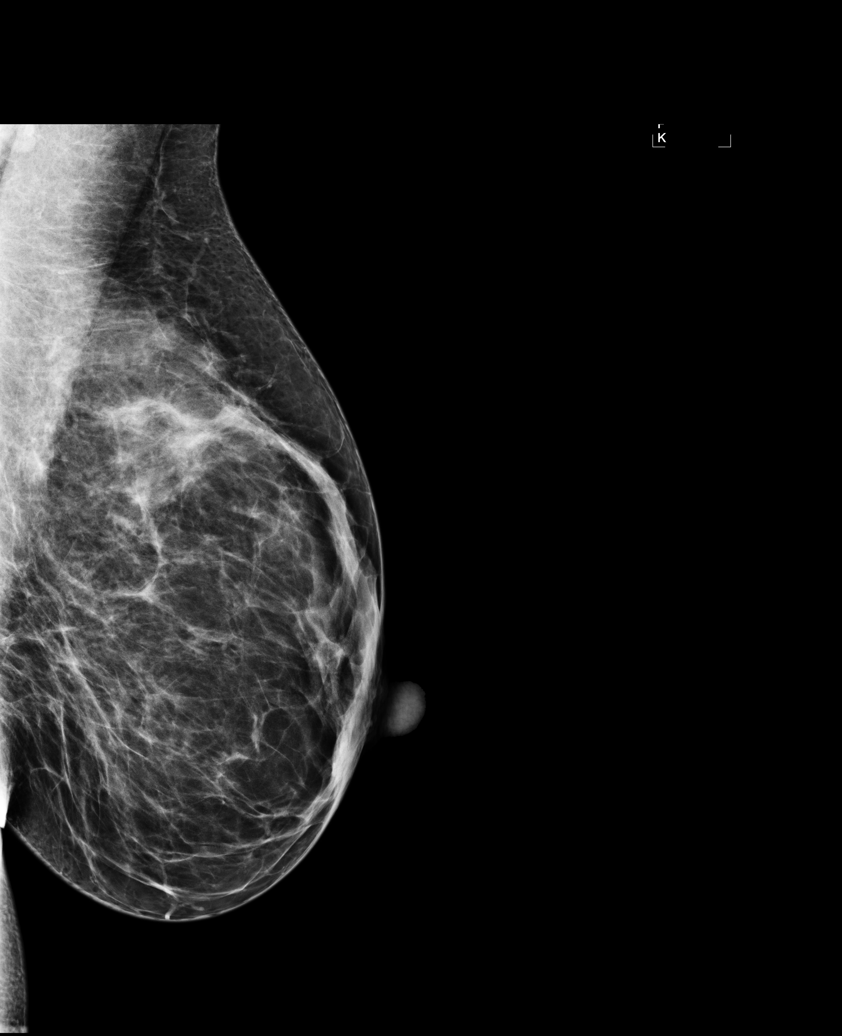

[R MLO (1 of 2)]
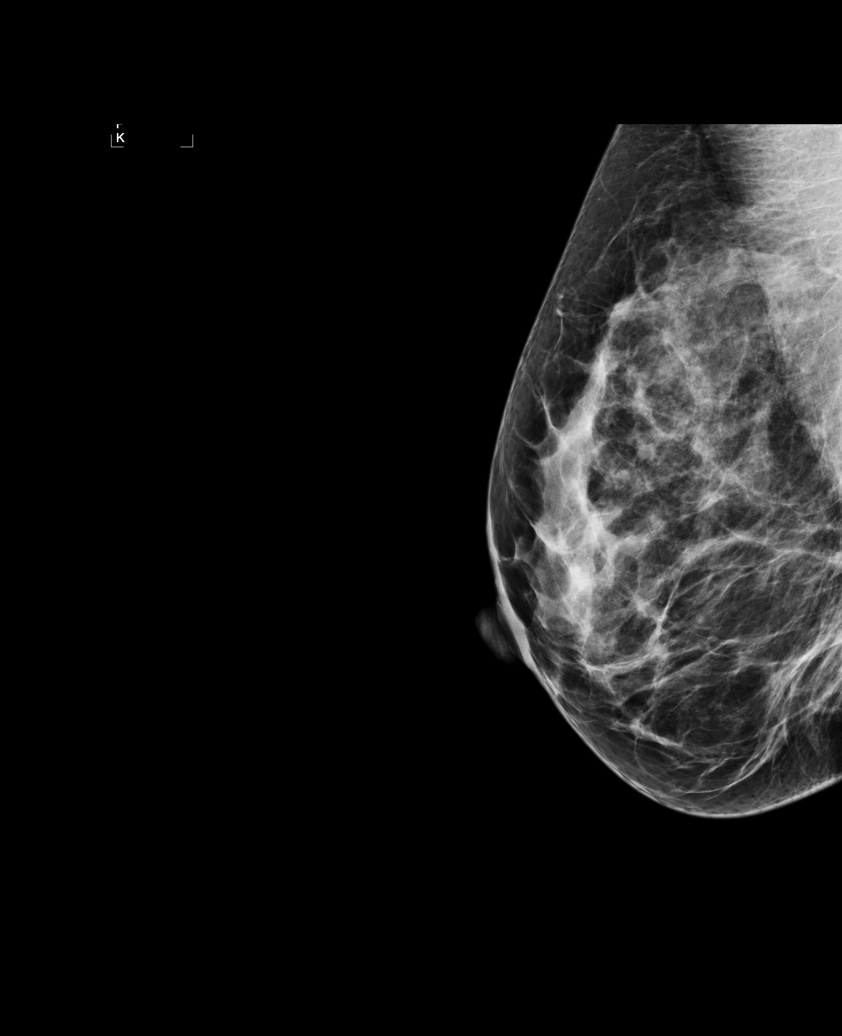

[R MLO (2 of 2)]
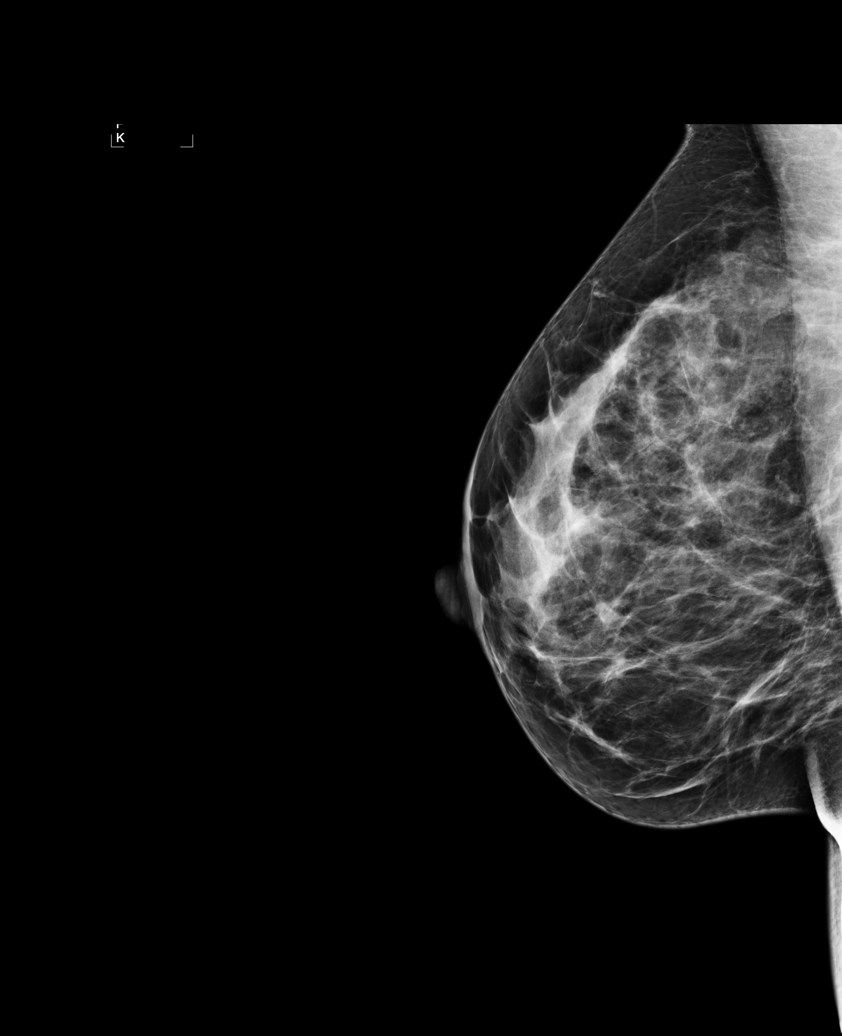

[5 of 5 positions shown; findings below may reference images not displayed]

ACR Breast Density Category b: There are scattered areas of
fibroglandular density.
FINDINGS: In the left breast, possible distortion warrants further evaluation.
In the right breast, no findings suspicious for malignancy. Images
were processed with CAD.
IMPRESSION: Further evaluation is suggested for possible distortion in the left
breast.

RECOMMENDATION:
Diagnostic mammogram and possibly ultrasound of the left breast.
(Code:AQ-V-DDO)

The patient will be contacted regarding the findings, and additional
imaging will be scheduled.

BI-RADS CATEGORY  0: Incomplete. Need additional imaging evaluation
and/or prior mammograms for comparison.

## 2017-01-11 ENCOUNTER — Other Ambulatory Visit: Payer: Self-pay | Admitting: Obstetrics and Gynecology

## 2017-01-11 DIAGNOSIS — N6320 Unspecified lump in the left breast, unspecified quadrant: Secondary | ICD-10-CM

## 2017-01-30 ENCOUNTER — Other Ambulatory Visit: Payer: BC Managed Care – PPO

## 2017-02-08 ENCOUNTER — Ambulatory Visit
Admission: RE | Admit: 2017-02-08 | Discharge: 2017-02-08 | Disposition: A | Discharge status: Home / Self Care | Payer: BC Managed Care – PPO | Source: Ambulatory Visit | Attending: Obstetrics and Gynecology | Admitting: Obstetrics and Gynecology

## 2017-02-08 DIAGNOSIS — N6320 Unspecified lump in the left breast, unspecified quadrant: Secondary | ICD-10-CM

## 2017-06-29 ENCOUNTER — Other Ambulatory Visit: Payer: Self-pay | Admitting: Obstetrics and Gynecology

## 2017-06-29 DIAGNOSIS — N63 Unspecified lump in unspecified breast: Secondary | ICD-10-CM

## 2017-08-13 ENCOUNTER — Ambulatory Visit
Admission: RE | Admit: 2017-08-13 | Discharge: 2017-08-13 | Disposition: A | Discharge status: Home / Self Care | Payer: BC Managed Care – PPO | Source: Ambulatory Visit | Attending: Obstetrics and Gynecology | Admitting: Obstetrics and Gynecology

## 2017-08-13 ENCOUNTER — Other Ambulatory Visit: Payer: Self-pay | Admitting: Obstetrics and Gynecology

## 2017-08-13 DIAGNOSIS — N63 Unspecified lump in unspecified breast: Secondary | ICD-10-CM

## 2017-09-10 ENCOUNTER — Other Ambulatory Visit (HOSPITAL_COMMUNITY)
Admission: RE | Admit: 2017-09-10 | Discharge: 2017-09-10 | Disposition: A | Discharge status: Home / Self Care | Payer: BC Managed Care – PPO | Source: Ambulatory Visit | Attending: Obstetrics and Gynecology | Admitting: Obstetrics and Gynecology

## 2017-09-10 ENCOUNTER — Other Ambulatory Visit: Payer: Self-pay | Admitting: Obstetrics and Gynecology

## 2017-09-10 DIAGNOSIS — Z01411 Encounter for gynecological examination (general) (routine) with abnormal findings: Secondary | ICD-10-CM | POA: Diagnosis present

## 2017-09-12 LAB — CYTOLOGY - PAP
Adequacy: ABSENT
Diagnosis: NEGATIVE

## 2018-10-07 ENCOUNTER — Other Ambulatory Visit (HOSPITAL_COMMUNITY)
Admission: RE | Admit: 2018-10-07 | Discharge: 2018-10-07 | Disposition: A | Discharge status: Home / Self Care | Payer: BC Managed Care – PPO | Source: Ambulatory Visit | Attending: Obstetrics and Gynecology | Admitting: Obstetrics and Gynecology

## 2018-10-07 ENCOUNTER — Other Ambulatory Visit: Payer: Self-pay | Admitting: Obstetrics and Gynecology

## 2018-10-07 DIAGNOSIS — Z1231 Encounter for screening mammogram for malignant neoplasm of breast: Secondary | ICD-10-CM

## 2018-10-07 DIAGNOSIS — Z01411 Encounter for gynecological examination (general) (routine) with abnormal findings: Secondary | ICD-10-CM | POA: Insufficient documentation

## 2018-10-09 LAB — CYTOLOGY - PAP
Diagnosis: NEGATIVE
HPV: NOT DETECTED

## 2018-10-10 ENCOUNTER — Ambulatory Visit
Admission: RE | Admit: 2018-10-10 | Discharge: 2018-10-10 | Disposition: A | Discharge status: Home / Self Care | Payer: BC Managed Care – PPO | Source: Ambulatory Visit | Attending: Obstetrics and Gynecology | Admitting: Obstetrics and Gynecology

## 2018-10-10 DIAGNOSIS — Z1231 Encounter for screening mammogram for malignant neoplasm of breast: Secondary | ICD-10-CM

## 2019-10-30 ENCOUNTER — Other Ambulatory Visit: Payer: Self-pay | Admitting: Obstetrics and Gynecology

## 2019-10-30 DIAGNOSIS — Z1231 Encounter for screening mammogram for malignant neoplasm of breast: Secondary | ICD-10-CM

## 2019-10-31 ENCOUNTER — Ambulatory Visit
Admission: RE | Admit: 2019-10-31 | Discharge: 2019-10-31 | Disposition: A | Discharge status: Home / Self Care | Payer: BC Managed Care – PPO | Source: Ambulatory Visit | Attending: Obstetrics and Gynecology | Admitting: Obstetrics and Gynecology

## 2019-10-31 DIAGNOSIS — Z1231 Encounter for screening mammogram for malignant neoplasm of breast: Secondary | ICD-10-CM

## 2020-01-24 ENCOUNTER — Ambulatory Visit: Payer: BC Managed Care – PPO | Attending: Internal Medicine

## 2020-01-24 DIAGNOSIS — Z23 Encounter for immunization: Secondary | ICD-10-CM

## 2020-01-24 NOTE — Progress Notes (Signed)
   Covid-19 Vaccination Clinic  Name:  Wendy Tyler    MRN: [id number] DOB: July 14, 1976  01/24/2020  Wendy Tyler was observed post Covid-19 immunization for 15 minutes without incident. She was provided with Vaccine Information Sheet and instruction to access the V-Safe system.   Wendy Tyler was instructed to call 911 with any severe reactions post vaccine: Marland Kitchen Difficulty breathing  . Swelling of face and throat  . A fast heartbeat  . A bad rash all over body  . Dizziness and weakness   Immunizations Administered    Name Date Dose VIS Date Route   Pfizer COVID-19 Vaccine 01/24/2020  8:35 AM 0.3 mL 10/31/2019 Intramuscular   Manufacturer: [employer], [employer]   Lot: [id number]   NDC: [address]-4

## 2020-02-14 ENCOUNTER — Ambulatory Visit: Payer: BC Managed Care – PPO | Attending: Internal Medicine

## 2020-02-14 DIAGNOSIS — Z23 Encounter for immunization: Secondary | ICD-10-CM

## 2020-02-14 NOTE — Progress Notes (Signed)
   Covid-19 Vaccination Clinic  Name:  Wendy Tyler    MRN: [id number] DOB: Sep 08, 1976  02/14/2020  [name] was observed post Covid-19 immunization for 15 minutes without incident. She was provided with Vaccine Information Sheet and instruction to access the V-Safe system.   [name] was instructed to call 911 with any severe reactions post vaccine: Marland Kitchen Difficulty breathing  . Swelling of face and throat  . A fast heartbeat  . A bad rash all over body  . Dizziness and weakness   Immunizations Administered    Name Date Dose VIS Date Route   Pfizer COVID-19 Vaccine 02/14/2020 10:44 AM 0.3 mL 10/31/2019 Intramuscular   Manufacturer: [employer], [employer]   Lot: [id number]   NDC: [address]-7

## 2020-12-22 ENCOUNTER — Other Ambulatory Visit: Payer: Self-pay | Admitting: Obstetrics and Gynecology

## 2020-12-22 DIAGNOSIS — Z1231 Encounter for screening mammogram for malignant neoplasm of breast: Secondary | ICD-10-CM

## 2021-01-24 ENCOUNTER — Inpatient Hospital Stay: Admission: RE | Admit: 2021-01-24 | Payer: BC Managed Care – PPO | Source: Ambulatory Visit

## 2021-01-26 ENCOUNTER — Other Ambulatory Visit: Payer: Self-pay

## 2021-01-26 ENCOUNTER — Ambulatory Visit
Admission: RE | Admit: 2021-01-26 | Discharge: 2021-01-26 | Disposition: A | Discharge status: Home / Self Care | Payer: BC Managed Care – PPO | Source: Ambulatory Visit | Attending: Obstetrics and Gynecology | Admitting: Obstetrics and Gynecology

## 2021-01-26 DIAGNOSIS — Z1231 Encounter for screening mammogram for malignant neoplasm of breast: Secondary | ICD-10-CM

## 2021-03-28 ENCOUNTER — Ambulatory Visit: Payer: BC Managed Care – PPO | Attending: Internal Medicine

## 2021-03-28 DIAGNOSIS — Z20822 Contact with and (suspected) exposure to covid-19: Secondary | ICD-10-CM

## 2021-03-29 LAB — NOVEL CORONAVIRUS, NAA: SARS-CoV-2, NAA: DETECTED — AB

## 2021-03-29 LAB — SARS-COV-2, NAA 2 DAY TAT

## 2021-12-29 ENCOUNTER — Other Ambulatory Visit (HOSPITAL_COMMUNITY)
Admission: RE | Admit: 2021-12-29 | Discharge: 2021-12-29 | Disposition: A | Discharge status: Home / Self Care | Payer: BC Managed Care – PPO | Source: Ambulatory Visit | Attending: Obstetrics and Gynecology | Admitting: Obstetrics and Gynecology

## 2021-12-29 ENCOUNTER — Other Ambulatory Visit: Payer: Self-pay | Admitting: Obstetrics and Gynecology

## 2021-12-29 DIAGNOSIS — Z01419 Encounter for gynecological examination (general) (routine) without abnormal findings: Secondary | ICD-10-CM | POA: Insufficient documentation

## 2021-12-30 LAB — CYTOLOGY - PAP
Comment: NEGATIVE
Diagnosis: NEGATIVE
High risk HPV: NEGATIVE

## 2022-02-28 ENCOUNTER — Other Ambulatory Visit: Payer: Self-pay | Admitting: Obstetrics and Gynecology

## 2022-02-28 ENCOUNTER — Ambulatory Visit
Admission: RE | Admit: 2022-02-28 | Discharge: 2022-02-28 | Disposition: A | Discharge status: Home / Self Care | Payer: BC Managed Care – PPO | Source: Ambulatory Visit | Attending: Obstetrics and Gynecology | Admitting: Obstetrics and Gynecology

## 2022-02-28 DIAGNOSIS — Z1231 Encounter for screening mammogram for malignant neoplasm of breast: Secondary | ICD-10-CM

## 2023-05-14 ENCOUNTER — Other Ambulatory Visit: Payer: Self-pay | Admitting: Obstetrics and Gynecology

## 2023-05-14 DIAGNOSIS — Z1231 Encounter for screening mammogram for malignant neoplasm of breast: Secondary | ICD-10-CM

## 2023-05-15 ENCOUNTER — Ambulatory Visit
Admission: RE | Admit: 2023-05-15 | Discharge: 2023-05-15 | Disposition: A | Discharge status: Home / Self Care | Payer: BC Managed Care – PPO | Source: Ambulatory Visit | Attending: Obstetrics and Gynecology | Admitting: Obstetrics and Gynecology

## 2023-05-15 DIAGNOSIS — Z1231 Encounter for screening mammogram for malignant neoplasm of breast: Secondary | ICD-10-CM

## 2023-08-11 IMAGING — MG MM DIGITAL SCREENING BILAT W/ TOMO AND CAD
6 of 12 series · 6 of 36 positions shown · non-contrast
Comparison: Previous exam(s).

CLINICAL DATA: Screening.

EXAM:
DIGITAL SCREENING BILATERAL MAMMOGRAM WITH TOMOSYNTHESIS AND CAD
TECHNIQUE: Bilateral screening digital craniocaudal and mediolateral oblique
mammograms were obtained. Bilateral screening digital breast
tomosynthesis was performed. The images were evaluated with
computer-aided detection.

[L MLO synth-2D (1 of 2)]
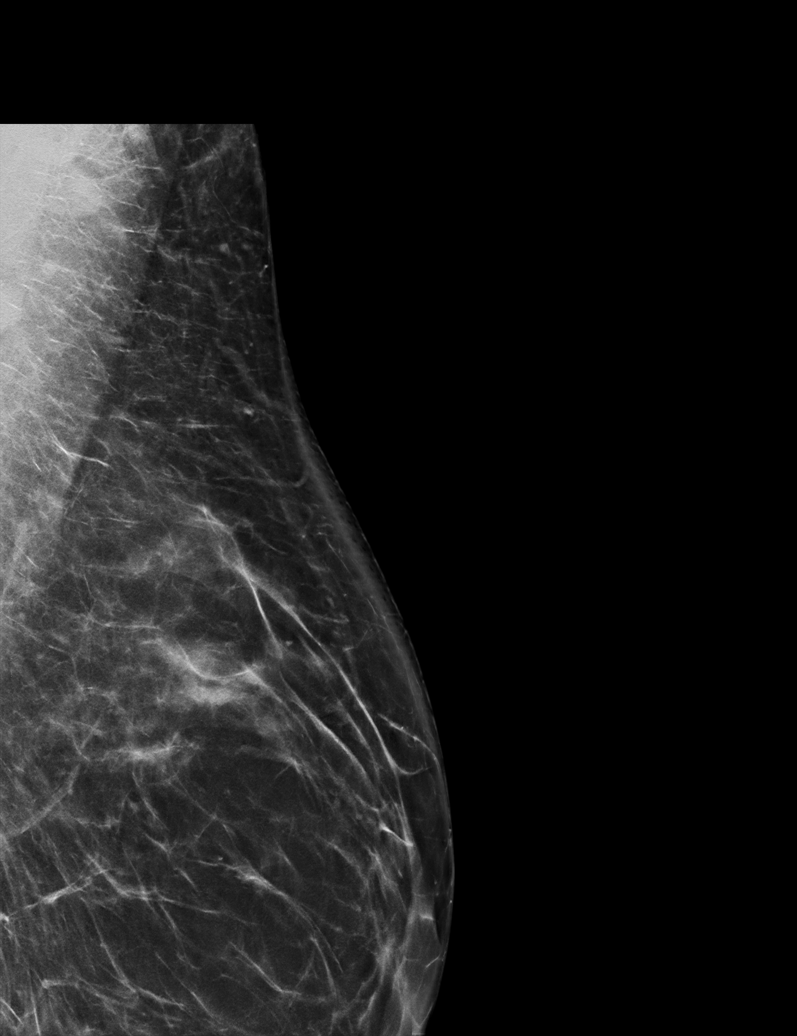

[R CC synth-2D]
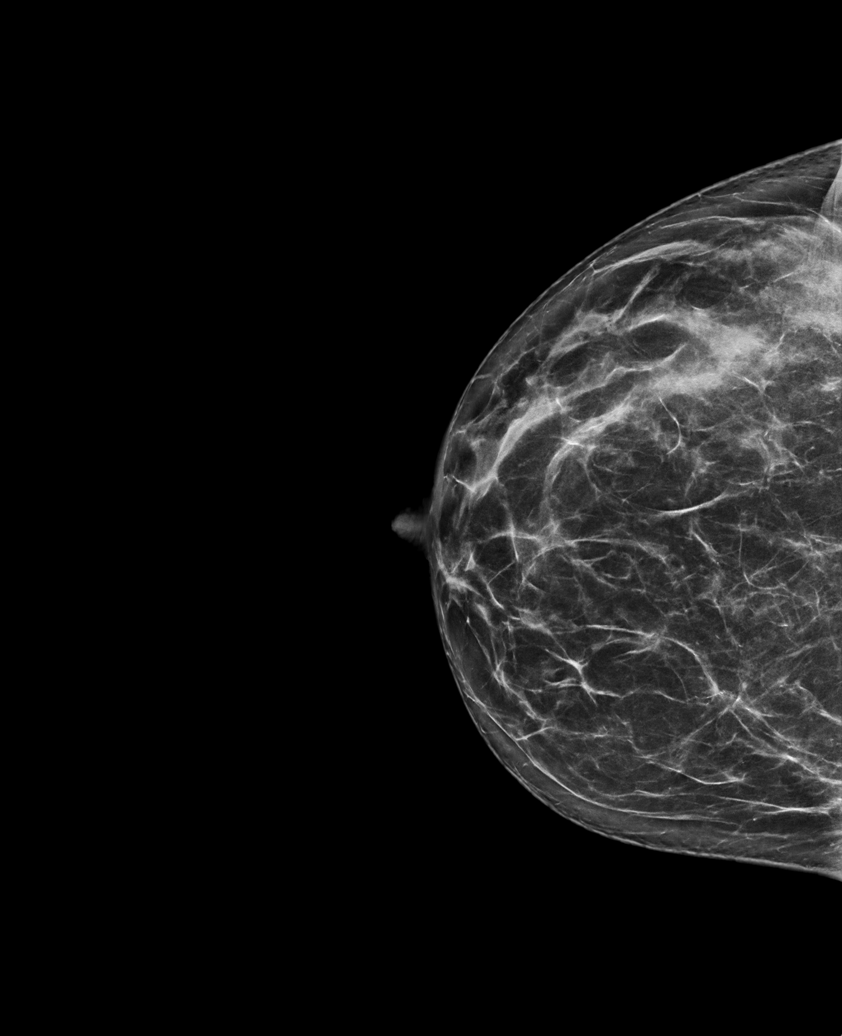

[R MLO synth-2D (1 of 2)]
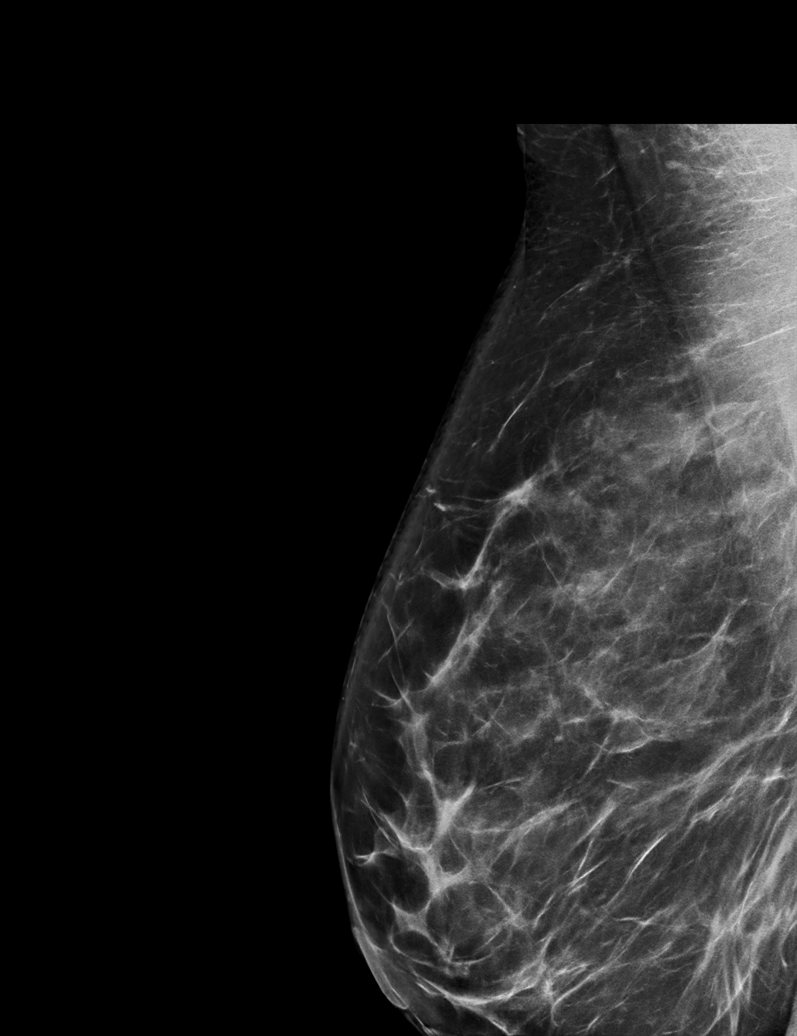

[L MLO synth-2D (2 of 2)]
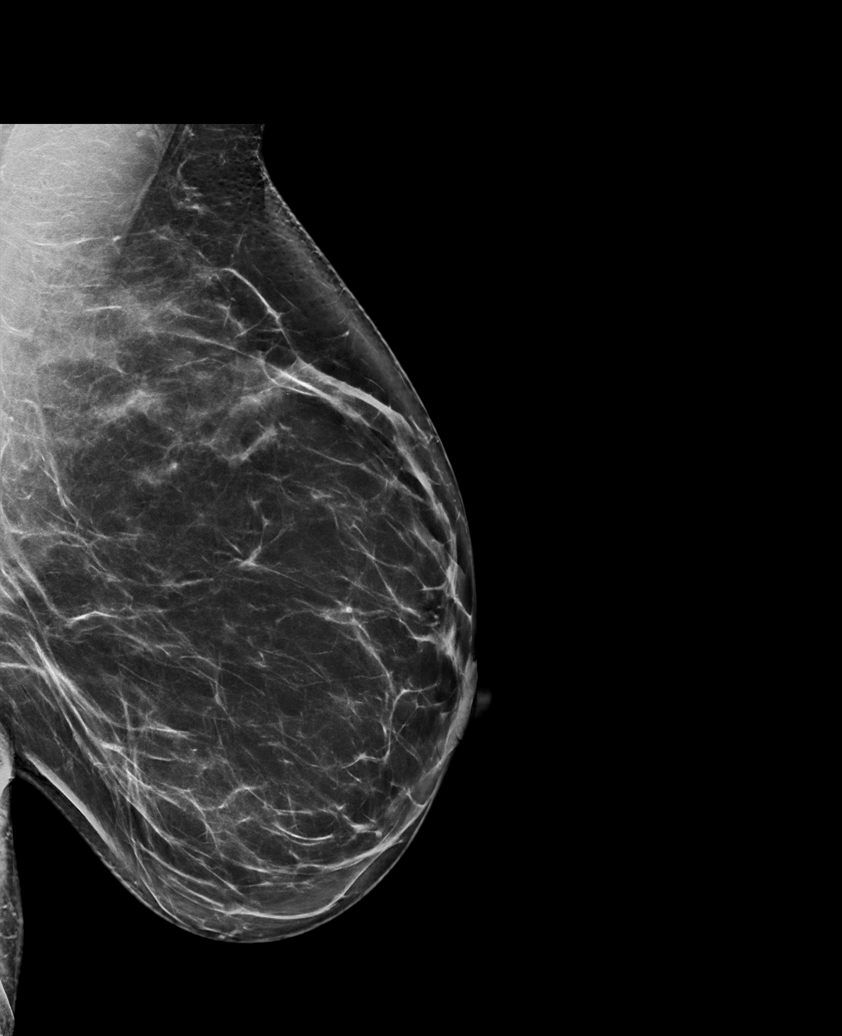

[R MLO synth-2D (2 of 2)]
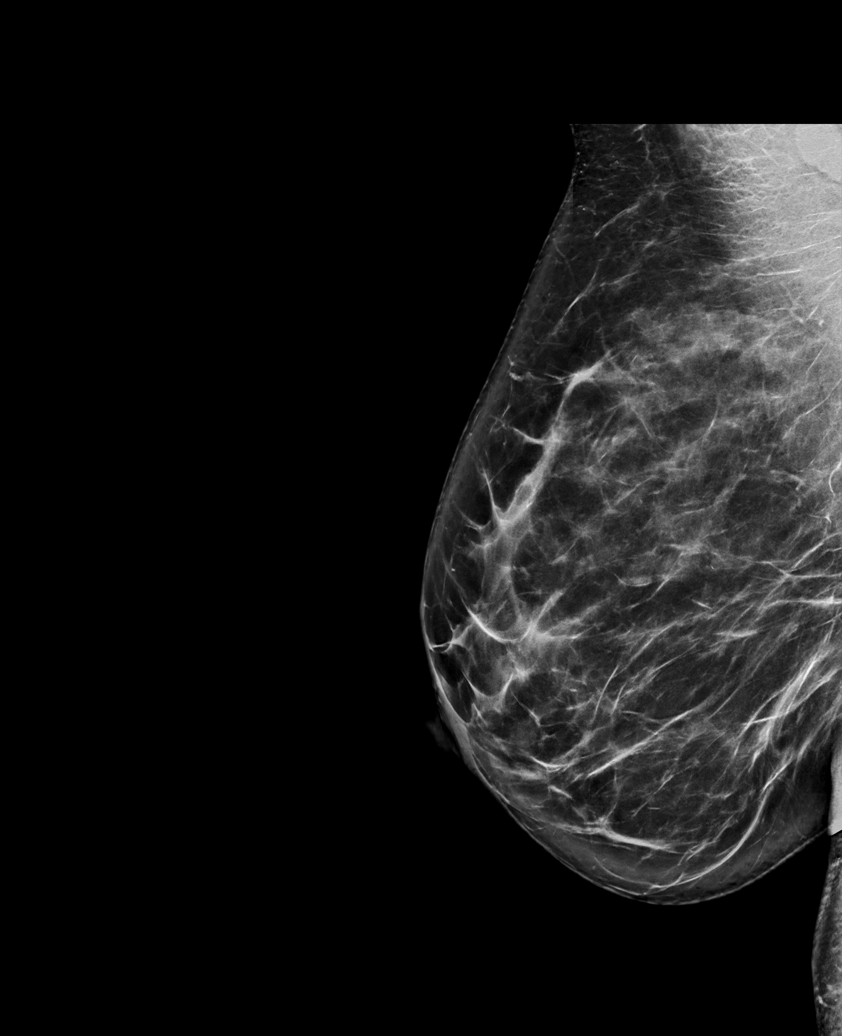

[L CC synth-2D]
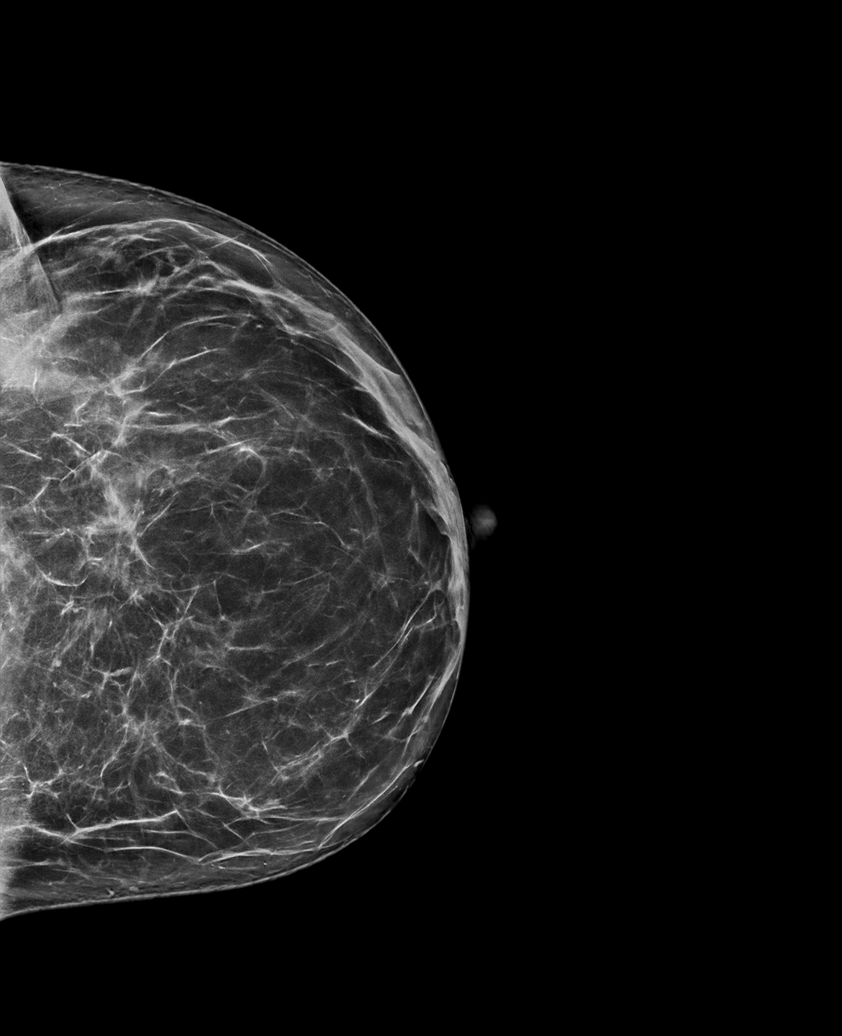

[6 of 36 positions shown; findings below may reference images not displayed]

ACR Breast Density Category b: There are scattered areas of
fibroglandular density.
FINDINGS: There are no findings suspicious for malignancy.
IMPRESSION: No mammographic evidence of malignancy. A result letter of this
screening mammogram will be mailed directly to the patient.

RECOMMENDATION:
Screening mammogram in one year. (Code:51-O-LD2)

BI-RADS CATEGORY  1: Negative.
